# Patient Record
Sex: Female | Born: 1995 | Race: White | Hispanic: No | Marital: Single | Smoking: Current every day smoker
Health system: Southern US, Community
[De-identification: ages and names within clinical notes are randomized; demographics above are authoritative.]

## PROBLEM LIST (undated history)

## (undated) DIAGNOSIS — F419 Anxiety disorder, unspecified: Secondary | ICD-10-CM

## (undated) DIAGNOSIS — F32A Depression, unspecified: Secondary | ICD-10-CM

## (undated) DIAGNOSIS — F329 Major depressive disorder, single episode, unspecified: Secondary | ICD-10-CM

## (undated) DIAGNOSIS — F1910 Other psychoactive substance abuse, uncomplicated: Secondary | ICD-10-CM

## (undated) DIAGNOSIS — F319 Bipolar disorder, unspecified: Secondary | ICD-10-CM

## (undated) HISTORY — DX: Major depressive disorder, single episode, unspecified: F32.9

## (undated) HISTORY — DX: Anxiety disorder, unspecified: F41.9

## (undated) HISTORY — DX: Bipolar disorder, unspecified: F31.9

## (undated) HISTORY — DX: Depression, unspecified: F32.A

## (undated) HISTORY — DX: Other psychoactive substance abuse, uncomplicated: F19.10

---

## 2015-04-19 ENCOUNTER — Ambulatory Visit (INDEPENDENT_AMBULATORY_CARE_PROVIDER_SITE_OTHER): Payer: BLUE CROSS/BLUE SHIELD | Admitting: Physician Assistant

## 2015-04-19 VITALS — BP 120/64 | HR 95 | Temp 98.2°F | Resp 15 | Ht 62.5 in | Wt 98.0 lb

## 2015-04-19 DIAGNOSIS — F5000 Anorexia nervosa, unspecified: Secondary | ICD-10-CM | POA: Diagnosis not present

## 2015-04-19 DIAGNOSIS — F3161 Bipolar disorder, current episode mixed, mild: Secondary | ICD-10-CM | POA: Insufficient documentation

## 2015-04-19 LAB — COMPLETE METABOLIC PANEL WITH GFR
ALBUMIN: 5.2 g/dL — AB (ref 3.6–5.1)
ALK PHOS: 64 U/L (ref 33–115)
ALT: 12 U/L (ref 6–29)
AST: 17 U/L (ref 10–30)
BUN: 9 mg/dL (ref 7–25)
CO2: 27 mmol/L (ref 20–31)
Calcium: 10.4 mg/dL — ABNORMAL HIGH (ref 8.6–10.2)
Chloride: 100 mmol/L (ref 98–110)
Creat: 0.77 mg/dL (ref 0.50–1.10)
GFR, Est African American: >89 mL/min (ref >=60)
GFR, Est Non African American: >89 mL/min (ref >=60)
Glucose, Bld: 74 mg/dL (ref 65–99)
Potassium: 4 mmol/L (ref 3.5–5.3)
SODIUM: 143 mmol/L (ref 135–146)
TOTAL PROTEIN: 7.7 g/dL (ref 6.1–8.1)
Total Bilirubin: 0.7 mg/dL (ref 0.2–1.2)

## 2015-04-19 LAB — CBC WITH DIFFERENTIAL/PLATELET
BASOS PCT: 1 % (ref 0–1)
Basophils Absolute: 0.1 10*3/uL (ref 0.0–0.1)
Eosinophils Absolute: 0.1 10*3/uL (ref 0.0–0.7)
Eosinophils Relative: 1 % (ref 0–5)
HEMATOCRIT: 42.7 % (ref 36.0–46.0)
Hemoglobin: 15.3 g/dL — ABNORMAL HIGH (ref 12.0–15.0)
Lymphocytes Relative: 35 % (ref 12–46)
Lymphs Abs: 2.3 10*3/uL (ref 0.7–4.0)
MCH: 31.4 pg (ref 26.0–34.0)
MCHC: 35.8 g/dL (ref 30.0–36.0)
MCV: 87.5 fL (ref 78.0–100.0)
MONOS PCT: 6 % (ref 3–12)
MPV: 9.5 fL (ref 8.6–12.4)
Monocytes Absolute: 0.4 10*3/uL (ref 0.1–1.0)
NEUTROS ABS: 3.8 10*3/uL (ref 1.7–7.7)
NEUTROS PCT: 57 % (ref 43–77)
Platelets: 310 10*3/uL (ref 150–400)
RBC: 4.88 MIL/uL (ref 3.87–5.11)
RDW: 13.2 % (ref 11.5–15.5)
WBC: 6.7 10*3/uL (ref 4.0–10.5)

## 2015-04-19 NOTE — Patient Instructions (Signed)
I will contact you with your lab results as soon as they are available.   If you have not heard from me in 2 weeks, please contact me.  The fastest way to get your results is to register for My Chart (see the instructions on the last page of this printout).   

## 2015-04-19 NOTE — Progress Notes (Signed)
Dawn Hurley  MRN: [id number] DOB: [birth year]  Subjective:  Pt presents to clinic with evaluation for eating disorder to make sure she is medically stable for outpatient nutritional therapy.  She started with bulemia about age 19 y/o where she would binge and then purge by inducing vomiting.  Then she started to restriction her food but never her fluid intake.  She used to over exercise but she has not done that recently. Currently she only walks for relaxation.  She feels like she is doing good with her eating disorder but she wants to stay good which is why she wants therapy.  About 6 months ago a severe depression and she was started on prozec which caused a mania episode (she has since been diagnosed with bipolar) - Vyvanse for ADHD   [place] - wants to be without medications - and is working hard with therapy and mood charting to prevent mania and be aware of her triggers.  Last semester she got A/Bs - not sleeping or eating - and drinking quite a bit.  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    Substance - ETOH - binge drank purposely weaned off last semester - dad ETOH problems - none since this summer - coke and marijuana - with the drinking but since she stopped ETOH she has stopped the drugs because she only used them together -   Family history -  Maternal grandmother - eating disorder, maternal aunt eating disorder Dad - ETOHism, she thinks bipolar but it is not diagnosed  Patient Active Problem List   Diagnosis Date Noted  . Anorexia nervosa 04/19/2015  . Bipolar disorder, current episode mixed, mild 04/19/2015    No current outpatient prescriptions on file prior to visit.   No current facility-administered medications on file prior to visit.    No Known Allergies  Review of Systems  Respiratory: Positive for  shortness of breath (during panic attacks).   Cardiovascular: Positive for palpitations (during panic attacks).  Genitourinary: Negative for menstrual problem.  Psychiatric/Behavioral: Positive for sleep disturbance and dysphoric mood. Negative for suicidal ideas (not currently). The patient is nervous/anxious.    Objective:  BP 120/64 mmHg  Pulse 95  Temp(Src) 98.2 F (36.8 C) (Oral)  Resp 15  Ht 5' 2.5" (1.588 m)  Wt 98 lb (44.453 kg)  BMI 17.63 kg/m2  SpO2 99%  Physical Exam  Constitutional: She is oriented to person, place, and time and well-developed, well-nourished, and in no distress.  HENT:  Head: Normocephalic and atraumatic.  Right Ear: Hearing and external ear normal.  Left Ear: Hearing and external ear normal.  Eyes: Conjunctivae and EOM are normal. Pupils are equal, round, and reactive to light.  Neck: Normal range of motion. Neck supple.  Cardiovascular: Normal rate, regular rhythm and normal heart sounds.   No murmur heard. Pulmonary/Chest: Effort normal and breath sounds normal. She has no wheezes.  Musculoskeletal: Normal range of motion.  Neurological: She is alert and oriented to person, place, and time. She has normal sensation, normal strength, normal reflexes and intact cranial nerves. She has a normal Cerebellar Exam and a normal Romberg Test. Gait normal. Gait normal.  Normal rapid alternating movements  Skin: Skin is warm and dry.  Psychiatric: Mood, memory, affect and judgment normal.  Vitals reviewed.  EKG -  short PR, no acute change NSR  Results for orders placed or performed in visit on 04/19/15  CBC with Differential/Platelet  Result Value Ref Range   WBC 6.7 4.0 - 10.5 K/uL   RBC 4.88 3.87 - 5.11 MIL/uL   Hemoglobin 15.3 (H) 12.0 - 15.0 g/dL   HCT 42.7 36.0 - 46.0 %   MCV 87.5 78.0 - 100.0 fL   MCH 31.4 26.0 - 34.0 pg   MCHC 35.8 30.0 - 36.0 g/dL   RDW 13.2 11.5 - 15.5 %   Platelets 310 150 - 400 K/uL   MPV 9.5 8.6 - 12.4 fL    Neutrophils Relative % 57 43 - 77 %   Neutro Abs 3.8 1.7 - 7.7 K/uL   Lymphocytes Relative 35 12 - 46 %   Lymphs Abs 2.3 0.7 - 4.0 K/uL   Monocytes Relative 6 3 - 12 %   Monocytes Absolute 0.4 0.1 - 1.0 K/uL   Eosinophils Relative 1 0 - 5 %   Eosinophils Absolute 0.1 0.0 - 0.7 K/uL   Basophils Relative 1 0 - 1 %   Basophils Absolute 0.1 0.0 - 0.1 K/uL   Smear Review Criteria for review not met   COMPLETE METABOLIC PANEL WITH GFR  Result Value Ref Range   Sodium 143 135 - 146 mmol/L   Potassium 4.0 3.5 - 5.3 mmol/L   Chloride 100 98 - 110 mmol/L   CO2 27 20 - 31 mmol/L   Glucose, Bld 74 65 - 99 mg/dL   BUN 9 7 - 25 mg/dL   Creat 0.77 0.50 - 1.10 mg/dL   Total Bilirubin 0.7 0.2 - 1.2 mg/dL   Alkaline Phosphatase 64 33 - 115 U/L   AST 17 10 - 30 U/L   ALT 12 6 - 29 U/L   Total Protein 7.7 6.1 - 8.1 g/dL   Albumin 5.2 (H) 3.6 - 5.1 g/dL   Calcium 10.4 (H) 8.6 - 10.2 mg/dL   GFR, Est African American >89 >=60 mL/min   GFR, Est Non African American >89 >=60 mL/min  TSH  Result Value Ref Range   TSH 1.266 0.350 - 4.500 uIU/mL    Assessment and Plan :  Anorexia nervosa - Plan: CBC with Differential/Platelet, COMPLETE METABOLIC PANEL WITH GFR, TSH, EKG 12-Lead  Bipolar disorder, current episode mixed, mild   Patient is medication stable to participate in outpatient nutrition therapy.  She plans to go to [employer] for school and has a name of psychiatry there so she can continue her intensive therapy for bipolar treatment without medications.  Windell Hummingbird PA-C  [facility] 04/19/2015 3:24 PM

## 2015-04-20 ENCOUNTER — Encounter: Payer: Self-pay | Admitting: Family Medicine

## 2015-04-20 LAB — TSH: TSH: 1.266 u[IU]/mL (ref 0.350–4.500)

## 2015-05-16 ENCOUNTER — Ambulatory Visit: Payer: Self-pay | Admitting: Physician Assistant

## 2015-07-15 ENCOUNTER — Ambulatory Visit (INDEPENDENT_AMBULATORY_CARE_PROVIDER_SITE_OTHER): Payer: BLUE CROSS/BLUE SHIELD

## 2015-07-15 ENCOUNTER — Other Ambulatory Visit: Payer: Self-pay | Admitting: Family Medicine

## 2015-07-15 ENCOUNTER — Ambulatory Visit (INDEPENDENT_AMBULATORY_CARE_PROVIDER_SITE_OTHER): Payer: BLUE CROSS/BLUE SHIELD | Admitting: Family Medicine

## 2015-07-15 VITALS — BP 104/60 | HR 109 | Temp 98.0°F | Resp 16 | Ht 62.5 in | Wt 101.6 lb

## 2015-07-15 DIAGNOSIS — Z111 Encounter for screening for respiratory tuberculosis: Secondary | ICD-10-CM | POA: Diagnosis not present

## 2015-07-15 DIAGNOSIS — Z1329 Encounter for screening for other suspected endocrine disorder: Secondary | ICD-10-CM

## 2015-07-15 DIAGNOSIS — F908 Attention-deficit hyperactivity disorder, other type: Secondary | ICD-10-CM | POA: Diagnosis not present

## 2015-07-15 DIAGNOSIS — F3161 Bipolar disorder, current episode mixed, mild: Secondary | ICD-10-CM

## 2015-07-15 DIAGNOSIS — M545 Low back pain, unspecified: Secondary | ICD-10-CM

## 2015-07-15 DIAGNOSIS — Z139 Encounter for screening, unspecified: Secondary | ICD-10-CM

## 2015-07-15 DIAGNOSIS — F5000 Anorexia nervosa, unspecified: Secondary | ICD-10-CM

## 2015-07-15 DIAGNOSIS — Z130 Encounter for screening for diseases of the blood and blood-forming organs and certain disorders involving the immune mechanism: Secondary | ICD-10-CM

## 2015-07-15 DIAGNOSIS — Z0000 Encounter for general adult medical examination without abnormal findings: Secondary | ICD-10-CM | POA: Diagnosis not present

## 2015-07-15 DIAGNOSIS — F1910 Other psychoactive substance abuse, uncomplicated: Secondary | ICD-10-CM | POA: Diagnosis not present

## 2015-07-15 DIAGNOSIS — Z13228 Encounter for screening for other metabolic disorders: Secondary | ICD-10-CM

## 2015-07-15 DIAGNOSIS — Z1321 Encounter for screening for nutritional disorder: Secondary | ICD-10-CM

## 2015-07-15 LAB — COMPLETE METABOLIC PANEL WITH GFR
ALT: 20 U/L (ref 6–29)
AST: 18 U/L (ref 10–30)
Albumin: 4.8 g/dL (ref 3.6–5.1)
BUN: 5 mg/dL — ABNORMAL LOW (ref 7–25)
CO2: 25 mmol/L (ref 20–31)
Calcium: 9.4 mg/dL (ref 8.6–10.2)
Creat: 0.63 mg/dL (ref 0.50–1.10)
GFR, Est African American: >89 mL/min (ref >=60)
Total Bilirubin: 0.3 mg/dL (ref 0.2–1.2)
Total Protein: 7.4 g/dL (ref 6.1–8.1)

## 2015-07-15 LAB — CBC WITH DIFFERENTIAL/PLATELET
Basophils Absolute: 0 10*3/uL (ref 0.0–0.1)
Basophils Relative: 0 % (ref 0–1)
Eosinophils Absolute: 0 10*3/uL (ref 0.0–0.7)
Eosinophils Relative: 0 % (ref 0–5)
HCT: 42 % (ref 36.0–46.0)
Hemoglobin: 14.8 g/dL (ref 12.0–15.0)
Lymphocytes Relative: 15 % (ref 12–46)
Lymphs Abs: 2.1 10*3/uL (ref 0.7–4.0)
MCH: 31.2 pg (ref 26.0–34.0)
MCHC: 35.2 g/dL (ref 30.0–36.0)
MCV: 88.6 fL (ref 78.0–100.0)
MPV: 10.2 fL (ref 8.6–12.4)
Monocytes Absolute: 0.4 10*3/uL (ref 0.1–1.0)
Monocytes Relative: 3 % (ref 3–12)
Neutro Abs: 11.6 10*3/uL — ABNORMAL HIGH (ref 1.7–7.7)
Neutrophils Relative %: 82 % — ABNORMAL HIGH (ref 43–77)
Platelets: 262 10*3/uL (ref 150–400)
RBC: 4.74 MIL/uL (ref 3.87–5.11)
RDW: 13.2 % (ref 11.5–15.5)
WBC: 14.2 10*3/uL — ABNORMAL HIGH (ref 4.0–10.5)

## 2015-07-15 LAB — COMPLETE METABOLIC PANEL WITHOUT GFR
Alkaline Phosphatase: 69 U/L (ref 33–115)
Chloride: 102 mmol/L (ref 98–110)
GFR, Est Non African American: >89 mL/min (ref >=60)
Glucose, Bld: 95 mg/dL (ref 65–99)
Potassium: 4 mmol/L (ref 3.5–5.3)
Sodium: 138 mmol/L (ref 135–146)

## 2015-07-15 LAB — T4: T4, Total: 6.1 ug/dL (ref 4.5–12.0)

## 2015-07-15 LAB — T3: T3, Total: 85.3 ng/dL (ref 80.0–204.0)

## 2015-07-15 LAB — LIPASE: Lipase: 23 U/L (ref 7–60)

## 2015-07-15 LAB — AMYLASE: Amylase: 66 U/L (ref 0–105)

## 2015-07-15 LAB — FERRITIN: Ferritin: 45 ng/mL (ref 10–291)

## 2015-07-15 LAB — VITAMIN B12: Vitamin B-12: 548 pg/mL (ref 211–911)

## 2015-07-15 LAB — TSH: TSH: 2 u[IU]/mL (ref 0.350–4.500)

## 2015-07-15 LAB — POCT URINE PREGNANCY: Preg Test, Ur: NEGATIVE

## 2015-07-15 NOTE — Progress Notes (Signed)
 Chief Complaint:  Chief Complaint  Patient presents with  . Annual Exam    Labs needed. Patient has forms to be filled out  . Back Pain    HPI: Dawn Hurley is a 19 y.o. female who [facility] complaining of:  Needs PE for acceptance [facility] [place] [employer] [place] [employer] was seeing Dr Ruben ReasonAlisa Sugar Mood Cneter Kerin RansomKate Thealds counselor  She has been doing ok, she needs more help than what [employer] has to offer.  She has been home for 1 week, she is anorexic, she has had a subtance abuse issue. Last drink was 2 weeks ago, shared bottle wine with 2 other people LMP was 2 months ago. ON nexplanon No hospitalizations, no SI/HI No recent surgeries Last pelvic exam was 3 months, last pap was 2 years ago. She has another nexplanon in 1 year.  Her ob gyn is?  She has some back pain after climbing a tree and now has back pain , more and more. On right side of low back .   Larey SeatFell out of tree 2 months ago , hurt her back , low back on right side, she still has it, no incontinence.   Prior note:   Pt presents to clinic with evaluation for eating disorder to make sure she is medically stable for outpatient nutritional therapy. She started with bulemia about age 19 y/o where she would binge and then purge by inducing vomiting. Then she started to restriction her food but never her fluid intake. She used to over exercise but she has not done that recently. Currently she only walks for relaxation. She feels like she is doing good with her eating disorder but she wants to stay good which is why she wants therapy.  About 6 months ago a severe depression and she was started on prozec which caused a mania episode (she has since been diagnosed with bipolar) - Vyvanse for ADHD   [facility] - [place] - Bipolar - wants to be without medications - and is working hard with therapy and mood charting to prevent mania and be aware of her  triggers.  Last semester she got A/Bs - not sleeping or eating - and drinking quite a bit.  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   Substance - ETOH - binge drank purposely weaned off last semester - dad ETOH problems - none since this summer - coke and marijuana - with the drinking but since she stopped ETOH she has stopped the drugs because she only used them together -   Family history -  Maternal grandmother - eating disorder, maternal aunt eating disorder Dad - ETOHism, she thinks bipolar but it is not diagnosed  Past Medical History  Diagnosis Date  . Depression   . Substance abuse   . Bipolar 1 disorder (HCC)   . Anxiety    History reviewed. No pertinent past surgical history. Social History   Social History  . Marital Status: Single    Spouse Name: N/A  . Number of Children: N/A  . Years of Education: N/A   Social History Main Topics  . Smoking status: Current Every Day Smoker -- 1.00 packs/day    Types: Cigarettes  . Smokeless tobacco: None  . Alcohol  Use: No  . Drug Use: No  . Sexual Activity: Not Asked   Other Topics Concern  . None   Social History Narrative   Lives with mom and dad and younger brother         Family History  Problem Relation Age of Onset  . Mental illness Mother   . Mental illness Father   . Mental illness Brother   . Mental illness Maternal Grandmother   . Mental illness Paternal Grandmother   . Hyperlipidemia Paternal Grandmother   . Diabetes Paternal Grandfather    No Known Allergies Prior to Admission medications   Medication Sig Start Date End Date Taking? Authorizing Provider  Cyanocobalamin (B-12 PO) Take by mouth.   Yes Historical Provider, MD  etonogestrel (NEXPLANON) 68 MG IMPL implant 1 each by Subdermal route once.   Yes Historical Provider, MD  lisdexamfetamine (VYVANSE) 60 MG  capsule Take 60 mg by mouth every morning.   Yes Historical Provider, MD     ROS: The patient denies fevers, chills, night sweats, unintentional weight loss, chest pain, palpitations, wheezing, dyspnea on exertion, nausea, vomiting, abdominal pain, dysuria, hematuria, melena, numbness, weakness, or tingling.  All other systems have been reviewed and were otherwise negative with the exception of those mentioned in the HPI and as above.    PHYSICAL EXAM: Filed Vitals:   07/15/15 1427  BP: 104/60  Pulse: 109  Temp: 98 F (36.7 C)  Resp: 16   Body mass index is 18.28 kg/(m^2).   General: Alert, no acute distress HEENT:  Normocephalic, atraumatic, oropharynx patent. EOMI, PERRLA Cardiovascular:  Regular rate and rhythm, no rubs murmurs or gallops.  No Carotid bruits, radial pulse intact. No pedal edema.  Respiratory: Clear to auscultation bilaterally.  No wheezes, rales, or rhonchi.  No cyanosis, no use of accessory musculature Abdominal: No organomegaly, abdomen is soft and non-tender, positive bowel sounds. No masses. Skin: + excoriated areas on hand Neurologic: Facial musculature symmetric. Psychiatric: Patient acts appropriately throughout our interaction. Lymphatic: No cervical or submandibular lymphadenopathy Musculoskeletal: Gait intact. No edema, tenderness   LABS: Results for orders placed or performed in visit on 07/15/15  POCT urine pregnancy  Result Value Ref Range   Preg Test, Ur Negative Negative     EKG/XRAY:   Primary read interpreted by Dr. Conley Rolls at [facility]. Neg for fracture or dislocation   ASSESSMENT/PLAN: Encounter Diagnoses  Name Primary?  Marland Kitchen Anorexia nervosa   . Bipolar disorder, current episode mixed, mild (HCC)   . Attention-deficit hyperactivity disorder, other type   . Annual physical exam Yes  . Screening for tuberculosis   . Screening for thyroid disorder   . Midline low back pain without sciatica   . Substance abuse    Dawn Hurley is a very pleasant  19 year old Caucasian  Woman with a PMH of anorexia nervosa, alcohol abuse, tobacco dependence, ADHD, bipolar disorder who is here to get a PE for entry into [facility] a residential treatment center in [place]. She was at [employer] where she is in school but felt she was falling back into a spiraling pattern  and needed more help.  Labs pending EKG was SR PPD results Pending Fu in 2 days for PPD results    Gross sideeffects, risk and benefits, and alternatives of medications d/w patient. Patient is aware that all medications have potential sideeffects and we are unable to predict every sideeffect or drug-drug interaction that may occur.    DO  07/15/2015 4:58 PM

## 2015-07-16 ENCOUNTER — Telehealth: Payer: Self-pay | Admitting: Family Medicine

## 2015-07-16 DIAGNOSIS — F908 Attention-deficit hyperactivity disorder, other type: Secondary | ICD-10-CM | POA: Insufficient documentation

## 2015-07-16 LAB — VITAMIN D 25 HYDROXY (VIT D DEFICIENCY, FRACTURES): Vit D, 25-Hydroxy: 42 ng/mL (ref 30–100)

## 2015-07-16 LAB — HEPATITIS B SURFACE ANTIGEN: Hepatitis B Surface Ag: NEGATIVE

## 2015-07-16 LAB — HEPATITIS B SURFACE ANTIBODY, QUANTITATIVE: Hep B S AB Quant (Post): 4.5 m[IU]/mL

## 2015-07-16 LAB — HEPATITIS C ANTIBODY: HCV Ab: NEGATIVE

## 2015-07-16 LAB — HEPATITIS A ANTIBODY, TOTAL: Hep A Total Ab: REACTIVE — AB

## 2015-07-16 LAB — ETHANOL: Alcohol, Ethyl (B): <10 mg/dL (ref 0–10)

## 2015-07-16 NOTE — Telephone Encounter (Signed)
Need to get clean catch urine from patient when she comes in for PPD reading.  Also forgot to order magnesium, phosphorous so will add it to prior blood draw, Additionally asked them to run wbc since elevated without cause.

## 2015-07-17 ENCOUNTER — Encounter (INDEPENDENT_AMBULATORY_CARE_PROVIDER_SITE_OTHER): Payer: BLUE CROSS/BLUE SHIELD

## 2015-07-17 ENCOUNTER — Telehealth: Payer: Self-pay | Admitting: Family Medicine

## 2015-07-17 DIAGNOSIS — Z0000 Encounter for general adult medical examination without abnormal findings: Secondary | ICD-10-CM

## 2015-07-17 DIAGNOSIS — Z111 Encounter for screening for respiratory tuberculosis: Secondary | ICD-10-CM

## 2015-07-17 DIAGNOSIS — D72829 Elevated white blood cell count, unspecified: Secondary | ICD-10-CM

## 2015-07-17 LAB — POC MICROSCOPIC URINALYSIS (UMFC)

## 2015-07-17 LAB — POCT URINALYSIS DIP (MANUAL ENTRY)
Bilirubin, UA: NEGATIVE
Glucose, UA: NEGATIVE
Ketones, POC UA: NEGATIVE
Nitrite, UA: NEGATIVE
Protein Ur, POC: NEGATIVE
Spec Grav, UA: 1.015
Urobilinogen, UA: 0.2
pH, UA: 7

## 2015-07-17 LAB — TB SKIN TEST
Induration: 0 mm
TB Skin Test: NEGATIVE

## 2015-07-17 LAB — PHOSPHORUS: Phosphorus: 4 mg/dL (ref 2.5–4.5)

## 2015-07-17 LAB — MAGNESIUM: Magnesium: 1.9 mg/dL (ref 1.5–2.5)

## 2015-07-17 NOTE — Telephone Encounter (Signed)
Spoke with patient about labs, she feels under the weather but that us consistent for her due to eating irregularities. She has no UTI sxs or cold sxs, Will leave CBC alone.

## 2015-07-17 NOTE — Telephone Encounter (Signed)
LM with mom that Dawn Hurley needs to call me, she apparently lost her phone. She needs to givem me a clean catch urine which I forgot to order last time.

## 2015-07-18 ENCOUNTER — Telehealth: Payer: Self-pay | Admitting: Family Medicine

## 2015-07-18 LAB — AMPHETAMINES (GC/LC/MS), URINE
Amphetamine GC/MS Conf: 2276 ng/mL — AB (ref <250)
Methamphetamine Quant, Ur: NEGATIVE ng/mL (ref <250)

## 2015-07-18 MED ORDER — CEPHALEXIN 500 MG PO CAPS: 500.0000 mg | ORAL_CAPSULE | Freq: Two times a day (BID) | ORAL | Status: AC

## 2015-07-18 NOTE — Telephone Encounter (Signed)
Spoke with mom about elevated white count, and urine shows trace leuks, urine cx pending, rx keflex in the meantime

## 2015-07-19 ENCOUNTER — Telehealth: Payer: Self-pay | Admitting: Radiology

## 2015-07-19 LAB — PRESCRIPTION MONITORING PROFILE (9 PANEL)
Barbiturate Screen, Urine: NEGATIVE ng/mL
Benzodiazepine Screen, Urine: NEGATIVE ng/mL
Cannabinoid Scrn, Ur: NEGATIVE ng/mL
Cocaine Metabolites: NEGATIVE ng/mL
Creatinine, Urine: 55.46 mg/dL (ref >20.0)
Methadone Screen, Urine: NEGATIVE ng/mL
Nitrites, Initial: NEGATIVE ug/mL
Opiate Screen, Urine: NEGATIVE ng/mL
Oxycodone Screen, Ur: NEGATIVE ng/mL
Propoxyphene: NEGATIVE ng/mL
pH, Initial: 6 pH (ref 4.5–8.9)

## 2015-07-19 NOTE — Telephone Encounter (Signed)
Faxed copy of latest EKG to [facility] in [place] [phone] per registration by Dr. Conley RollsLe.

## 2015-07-20 LAB — URINE CULTURE: Colony Count: >=100000

## 2015-07-24 NOTE — Telephone Encounter (Signed)
Labs faxed as well

## 2015-07-29 ENCOUNTER — Telehealth: Payer: Self-pay

## 2015-07-29 NOTE — Telephone Encounter (Signed)
Katie from the [facility] states they have already received records on pt but is missing the vaccination of the Hep A. Please fax to [phone] and the phone number is [phone]

## 2017-01-31 IMAGING — CR DG LUMBAR SPINE COMPLETE 4+V
5 series · 5 of 5 positions shown · non-contrast
Comparison: None.

CLINICAL DATA: Patient fell from tree

EXAM:
LUMBAR SPINE - COMPLETE 4+ VIEW

[AP]
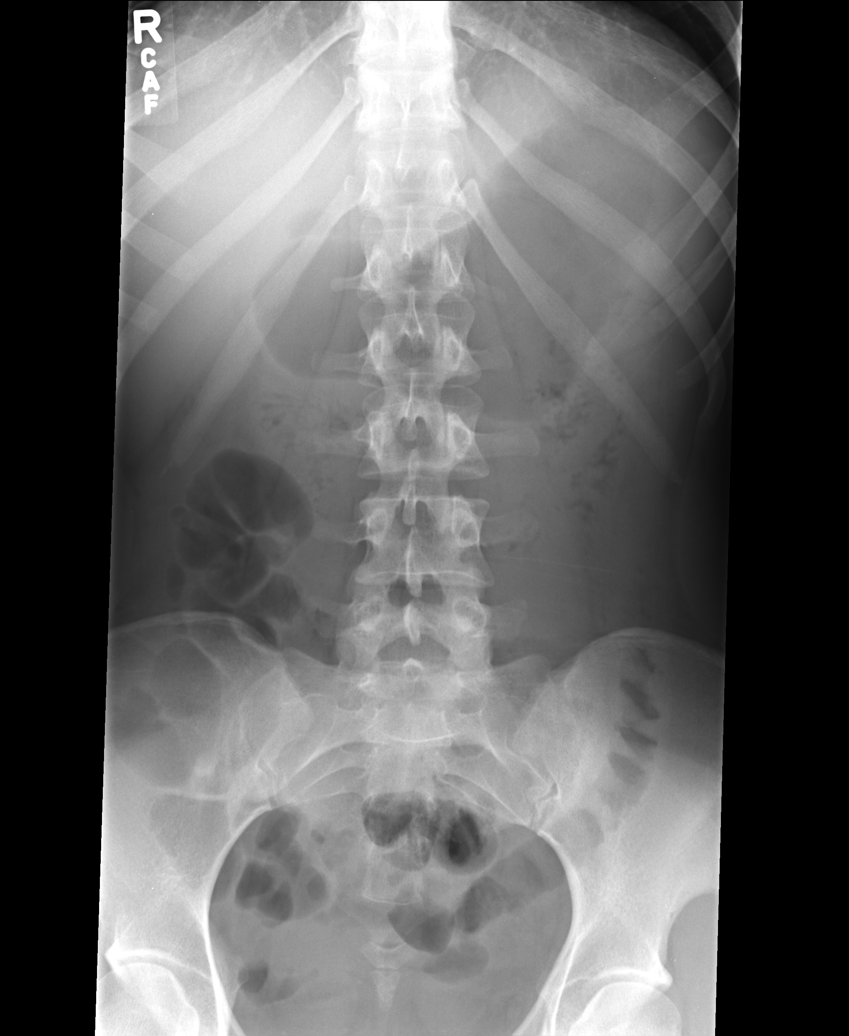

[rpo]
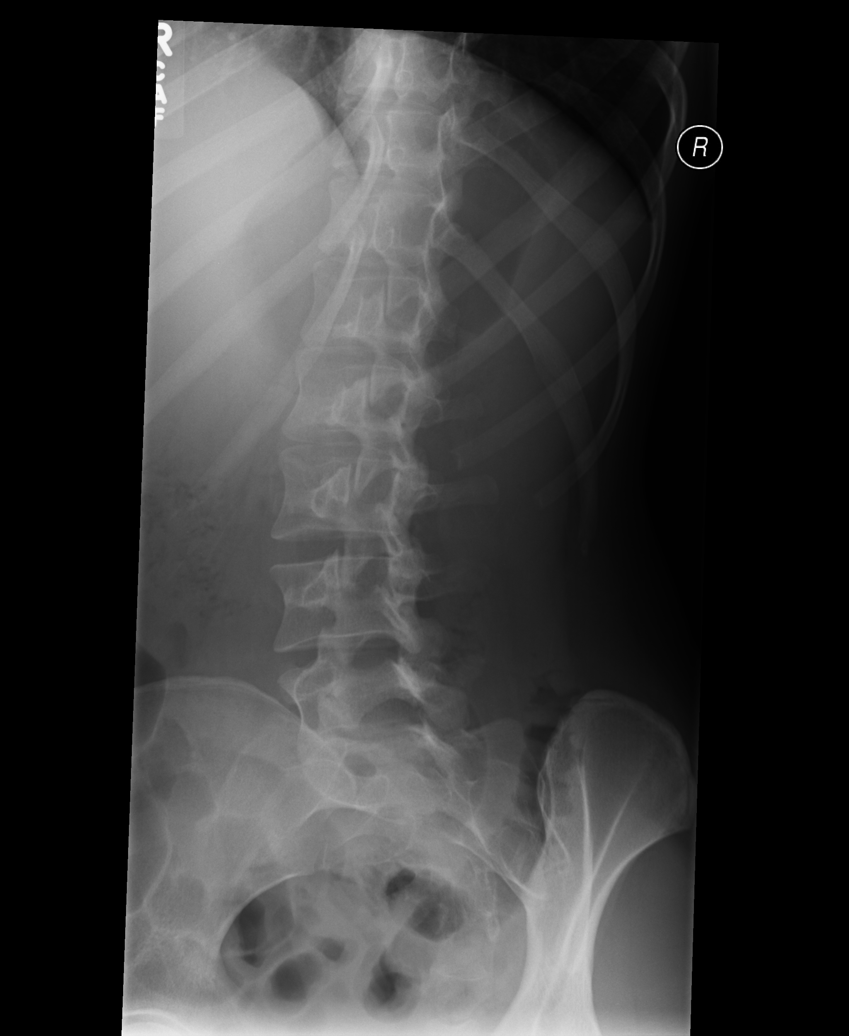

[lpo]
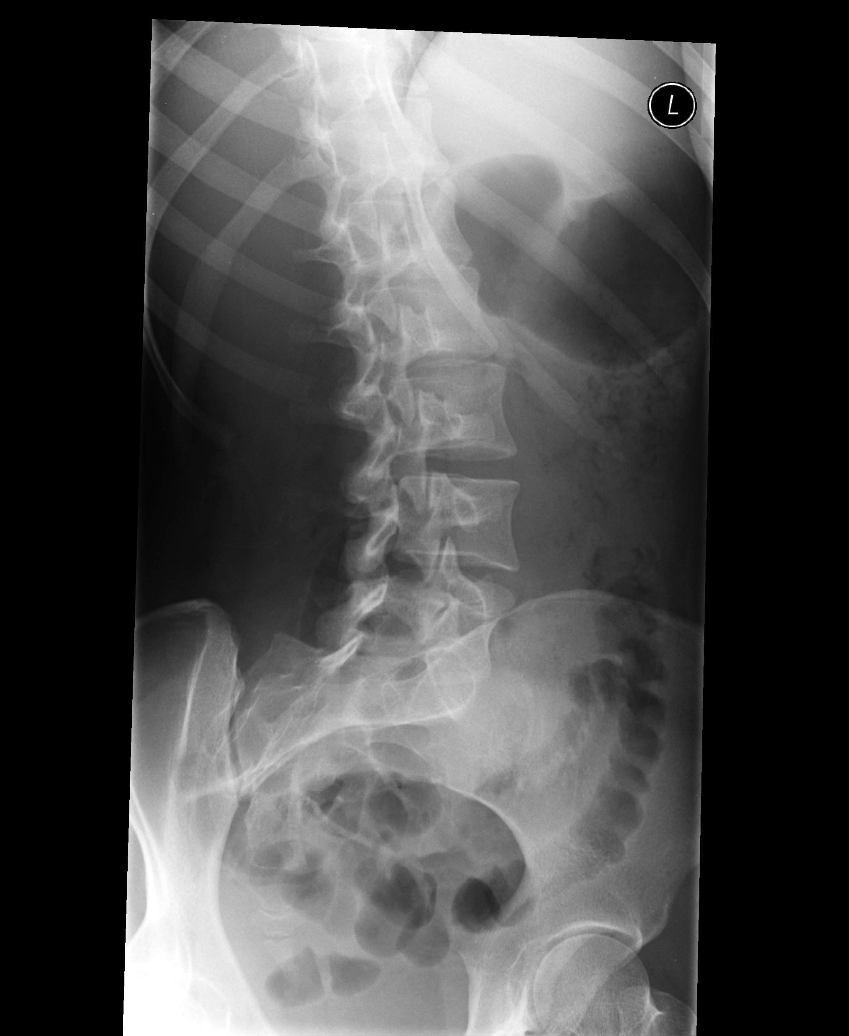

[lateral]
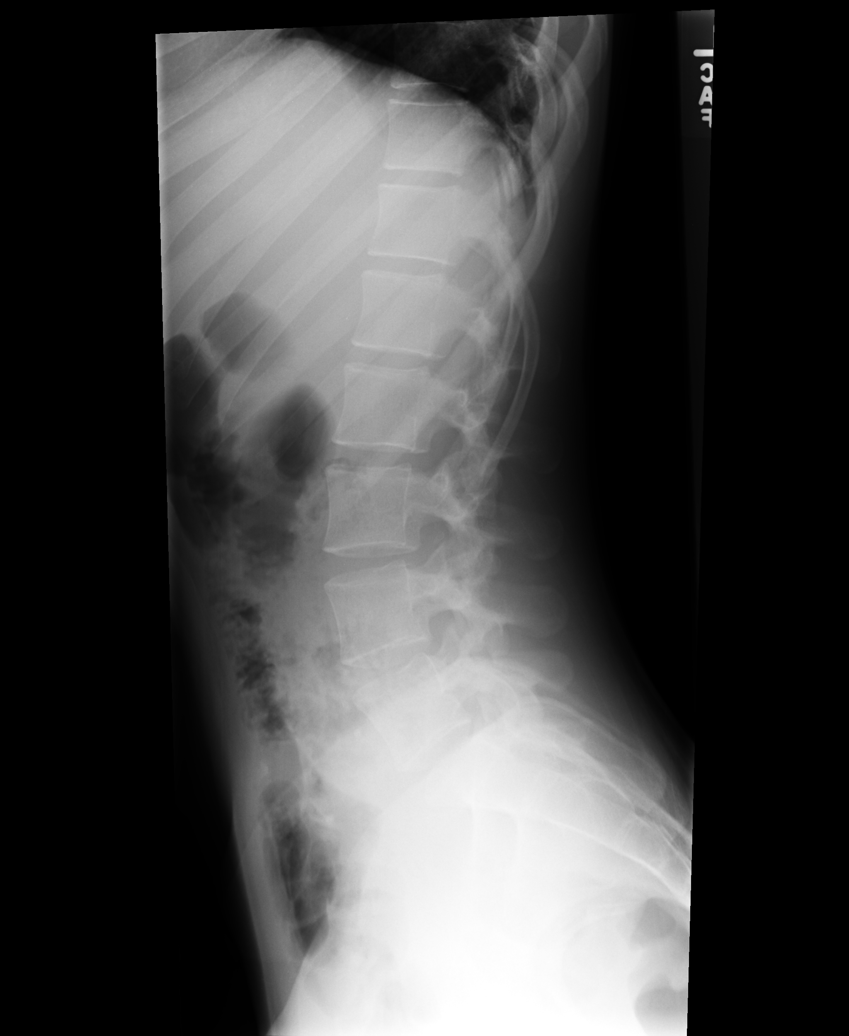

[l5 s1]
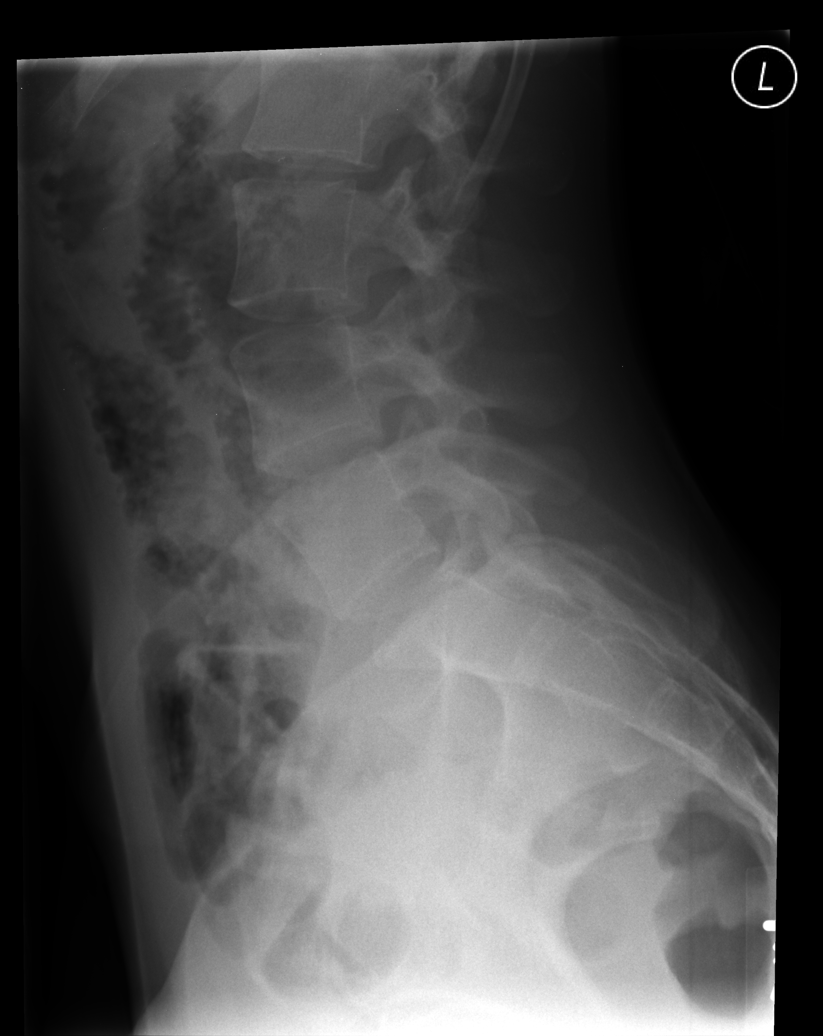

[5 of 5 positions shown; findings below may reference images not displayed]

FINDINGS: Frontal, lateral, spot lumbosacral lateral, and bilateral oblique
views were obtained. There are 5 non-rib-bearing lumbar type
vertebral bodies. There is no fracture or spondylolisthesis. Disc
spaces appear within normal limits. There is no appreciable facet
arthropathy.
IMPRESSION: No fracture or spondylolisthesis.  No appreciable arthropathy.
